# Patient Record
Sex: Female | Born: 1990 | Race: White | Hispanic: No | Marital: Single | Smoking: Never smoker
Health system: Southern US, Community
[De-identification: ages and names within clinical notes are randomized; demographics above are authoritative.]

---

## 2014-07-05 DIAGNOSIS — E039 Hypothyroidism, unspecified: Secondary | ICD-10-CM | POA: Insufficient documentation

## 2014-07-25 ENCOUNTER — Encounter: Payer: Self-pay | Admitting: Internal Medicine

## 2014-07-25 ENCOUNTER — Ambulatory Visit (INDEPENDENT_AMBULATORY_CARE_PROVIDER_SITE_OTHER): Payer: 59 | Admitting: Internal Medicine

## 2014-07-25 VITALS — BP 110/68 | HR 74 | Temp 98.1°F | Resp 12 | Ht 66.0 in | Wt 181.6 lb

## 2014-07-25 DIAGNOSIS — E221 Hyperprolactinemia: Secondary | ICD-10-CM

## 2014-07-25 DIAGNOSIS — E039 Hypothyroidism, unspecified: Secondary | ICD-10-CM

## 2014-07-25 DIAGNOSIS — D352 Benign neoplasm of pituitary gland: Secondary | ICD-10-CM

## 2014-07-25 NOTE — Patient Instructions (Signed)
Please come back for labs in 2 months >> we will see what other investigations we need after the results are back. Please return in 6 months with your sugar log.

## 2014-07-25 NOTE — Progress Notes (Addendum)
Jacqueline Ewing ID: Jacqueline Ewing, female   DOB: 05-Sep-1991, 23 y.o.   MRN: [id number]   HPI  Jacqueline Ewing is a 23 y.o.-year-old female, referred by her PCP, Dr. Drema Ewing, in consultation for hyperprolactinemia and also for management of hypothyroidism. She just moved from [place] - endocrinologist there: Dr. Dione Ewing. Almokayyad.  Hyperprolactinemia: She was found to have an elevated PRL in 03/2014 (? Level - will ask for records). At that time, she was having milky d/c from her breasts. This started 03/2014.   Latest level: 07/05/2014: 43.7  She has been on OCPs since 23 y/o for irregular, heavy periods, very painful. She started the brand name Ortho Tricyclen a year ago. No plans for pregnancy.  Has HAs - vertex, recently increased.   She does yoga min 3x a week.  She sleeps well. Not stressed at work.   Pt. has been dx with hypothyroidism in 03/2014; is on Levothyroxine 50 mcg, taken: - fasting - with water - separated by >30 min from b'fast  - no calcium, iron, PPIs, multivitamins   I reviewed pt's thyroid tests: 07/05/2014: TSH 1.35, fT4 0.74, TT3 140 (71-180)  Pt describes: - no fatigue - no weight gain - trying to lose weight - no no cold intolerance - no constipation - no dry skin - no hair falling - no depression - no palpitations  Pt denies feeling nodules in neck, hoarseness, dysphagia/odynophagia, SOB with lying down.  She has + FH of thyroid disorders in: both sides of family. No FH of thyroid cancer.  No h/o radiation tx to head or neck. No recent use of iodine supplements.  Reviewed labs sent bu PCP - 07/05/2014: - normal CMP - Lipids: 197/165/49/115 - normal CBC with diff   ROS: Constitutional: no weight gain/loss, no fatigue, no subjective hyperthermia/hypothermia Eyes: no blurry vision, no xerophthalmia ENT: no sore throat, no nodules palpated in throat, no dysphagia/odynophagia, no hoarseness, + tinnitus Cardiovascular: no CP/SOB/palpitations/leg  swelling Respiratory: no cough/SOB Gastrointestinal: no N/V/D/C Musculoskeletal: no muscle/joint aches Skin: no rashes Neurological: no tremors/numbness/tingling/dizziness, + HA Psychiatric: no depression/anxiety + see HPI  PMH: - see HPI  PSH: - none History   Social History  . Marital Status: Single    Spouse Name: N/A    Number of Children: 0   Occupational History  . Developmental specialist   Social History Main Topics  . Smoking status: Never Smoker   . Smokeless tobacco: Not on file  . Alcohol Use: 1.0 oz/week    2 drink(s) per week  . Drug Use: No   Social History Narrative   Single   Works: Artist    First Menstrual cycle: 14 yrs   0 pregnancies   Current Outpatient Rx  Name  Route  Sig  Dispense  Refill  . levothyroxine (SYNTHROID, LEVOTHROID) 50 MCG tablet   Oral   Take 50 mcg by mouth daily before breakfast.         . Norgestimate-Ethinyl Estradiol Triphasic (ORTHO TRI-CYCLEN, 28,) 0.18/0.215/0.25 MG-35 MCG tablet   Oral   Take 1 tablet by mouth daily.          NKDA  Family History  Problem Relation Age of Onset  . Thyroid disease Maternal Aunt   . Diabetes Maternal Uncle   . Diabetes Maternal Grandmother     Government social research officer  . Thyroid disease Maternal Grandmother   . Hypertension Paternal Grandfather   . Hyperlipidemia Paternal Grandfather   . Heart disease Paternal Grandfather   .  Thyroid disease Paternal Grandfather   . Thyroid disease Cousin    PE: BP 110/68  Pulse 74  Temp(Src) 98.1 F (36.7 C) (Oral)  Resp 12  Ht 5\' 6"  (1.676 m)  Wt 181 lb 9.6 oz (82.373 kg)  BMI 29.32 kg/m2  SpO2 97%  LMP 07/10/2014 Wt Readings from Last 3 Encounters:  07/25/14 181 lb 9.6 oz (82.373 kg)   Constitutional: overweight, in NAD Eyes: PERRLA, EOMI, no exophthalmos ENT: moist mucous membranes, no thyromegaly, no cervical lymphadenopathy Cardiovascular: RRR, No MRG Respiratory: CTA  B Gastrointestinal: abdomen soft, NT, ND, BS+ Musculoskeletal: no deformities, strength intact in all 4 Skin: moist, warm, no rashes Neurological: no tremor with outstretched hands, DTR normal in all 4  ASSESSMENT: 1. High PRL  2. Hypothyroidism  PLAN:  1. High Prolactin Jacqueline Ewing with persistent high prolactin levels. - I discussed with the Jacqueline Ewing about possible etiologies of high prolactin levels:  Pregnancy (denies, takes OCPs)  Hypothyroidism (she has this, but well controlled)  Stress (denies)  Exercise (yoga, walking - not intense)  Lack of sleep (denies)  Medications (she's not taking psychotropic medications or Reglan)  Drugs (on OCPs)  Chest wall lesions (denies)  Seizures (denies)  Liver ds (no)  Kidney disease (no)  A teratoma containing pituitary cells that can develop into prolactinoma (very rarely)  Macroprolactin (multimeric prolactin, especially since Jacqueline Ewing does not have galactorrhea)  Idiopathic - I believe the culprit for her high PRL can be her Estrogen supplementation in OCPs (breast d/c also mainly after her placebo week) - high prolactin most frequently impacts menstrual cycles, but she is on OCPs and we should not worry about this for now. No immediate plans for pregnancy. Her breast d/c is not spontaneous, not bothering her a lot. We discussed the possibility of tx of her high PRL levels (Cabergoline, low dose) if the high PRL level is not caused by macroprolactin. She agrees with the plan. - since hypothyroidism can impact the prolactin, we decided to wait 2 more months and recheck the TFTs and PRL, and if PRL not normalized, to proceed with further investigation - we will likely need her to come off OCPs for 2 mo before further labs.  2. Jacqueline Ewing with new dx hypothyroidism, on levothyroxine therapy, with normal TFTs now. We will obtain records from previous endo Re: previous TFTs and PRL levels. She appears euthyroid. She does not appear to  have a goiter, thyroid nodules, or neck compression symptoms - We discussed about correct intake of levothyroxine, fasting, with water, separated by at least 30 minutes from breakfast, and separated by more than 4 hours from calcium, iron, multivitamins, acid reflux medications (PPIs). - will check thyroid tests in 2 mo: TSH, free T4 - RTC in 6 months  Orders Placed This Encounter  Procedures  . TSH  . T4, free  . Thyroid Peroxidase Antibody  . Prolactin   Addendum 08/01/2014:  Received labs from [facility]: 04/01/2014: TSH 3.99, LH 0.1, prolactin 38.6 (1.8-20.3) 05/14/2014: CMP normal, Prolactin 45.3 (1.8-20.3), TPO antibodies 0.5 (less than 9), TSH 4.22  Component     Latest Ref Rng 09/24/2014  TSH     0.35 - 4.50 uIU/mL 1.45  Free T4     0.60 - 1.60 ng/dL 0.74  Thyroid Peroxidase Antibody     <9 IU/mL 1  Prolactin      50.8   Prolactin a little be higher than before. Thyroid tests are normal. In this case, I would like to investigate  her pituitary gland. I will ask the Jacqueline Ewing whether she agrees with a pituitary MRI.  Pituitary MRI 11/25/2014:  CLINICAL DATA: Elevated prolactin levels. Hyperprolactinemia.  EXAM: MRI HEAD WITHOUT AND WITH CONTRAST  TECHNIQUE: Multiplanar, multiecho pulse sequences of the brain and surrounding structures were obtained without and with intravenous contrast.  CONTRAST: 43mL MULTIHANCE GADOBENATE DIMEGLUMINE 529 MG/ML IV SOLN  COMPARISON: None.  FINDINGS: The pituitary gland is enlarged. A focus of differential enhancement along the right side of the gland measures 9 x 9.5 x 7 mm. The pituitary stalk is deviated to the left. There is no evidence for invasion of the cavernous sinus. Cavernous sinus is normal bilaterally. The optic chiasm and optic nerves are normal bilaterally as well.  The remainder the brain is unremarkable. No acute infarct, hemorrhage, or other mass lesion is present. The ventricles are  of normal size. No significant extraaxial fluid collection is present.  Postcontrast imaging through the remainder of the brain is unremarkable.  Flow is present in the major intracranial arteries. The globes and orbits are intact. Mild mucosal thickening is present in the anterior ethmoid air cells. The remaining paranasal sinuses and the mastoid air cells are clear.  IMPRESSION: 1. Right-sided pituitary microadenoma a measuring 9 x 9.5 x 7 mm. 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 2. The remainder the brain is unremarkable. 3. Minimal mucosal thickening within the anterior ethmoid air cells bilaterally. No other significant sinus disease is present.   Electronically Signed  By: Lawrence Santiago M.D.  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 The tumor is less than 1 cm and is unlikely to influence the rest of the pituitary function, but would like to call the Jacqueline Ewing back for a pituitary workup: - We cannot check LH and FSH since she is on oral contraceptives - We will check cortisol, ACTH, IGF-I - Thyroid tests have already been checked and she does not appear to have central hypothyroidism  I will see the Jacqueline Ewing back in 6 months, at that time, I would recheck the prolactin. There is only indication for repeat MRI if the prolactin increases. Indications for treatment are oligo/amenorrhea (this is a non-issue for her since she is on OCPs), galactorrhea (she has this), acne, hirsutism.   I called and d/w pt about new dx >> she will come for pituitary labs asap: Marland Kitchen ACTH  . Cortisol  . Insulin-like growth factor  . Growth hormone   Also, due to her galactorrhea, will start Cabergoline low dose, 0.25 mg weekly. Will recheck PRL when she returns. We discussed poss. SEs. Component     Latest Ref Rng 11/28/2014  IGF-I, LC/MS     83 - 456 ng/mL 222  Z-Score (Female)     -2.0-+2.0 SD 0.1   C206 ACTH     6 - 50 pg/mL 18  Cortisol, Plasma      15.6  Growth Hormone     <=7.1 ng/mL 1.5   The rest of the pituitary hormones are normal.

## 2014-08-01 ENCOUNTER — Encounter: Payer: Self-pay | Admitting: Internal Medicine

## 2014-08-01 DIAGNOSIS — E221 Hyperprolactinemia: Secondary | ICD-10-CM | POA: Insufficient documentation

## 2014-09-24 ENCOUNTER — Other Ambulatory Visit (INDEPENDENT_AMBULATORY_CARE_PROVIDER_SITE_OTHER): Payer: 59

## 2014-09-24 DIAGNOSIS — E221 Hyperprolactinemia: Secondary | ICD-10-CM

## 2014-09-24 DIAGNOSIS — E039 Hypothyroidism, unspecified: Secondary | ICD-10-CM

## 2014-09-24 LAB — TSH: TSH: 1.45 u[IU]/mL (ref 0.35–4.50)

## 2014-09-24 LAB — T4, FREE: Free T4: 0.74 ng/dL (ref 0.60–1.60)

## 2014-09-25 LAB — THYROID PEROXIDASE ANTIBODY: Thyroperoxidase Ab SerPl-aCnc: 1 IU/mL (ref <9)

## 2014-09-25 LAB — PROLACTIN: Prolactin: 50.8 ng/mL

## 2014-10-22 ENCOUNTER — Other Ambulatory Visit: Payer: 59

## 2014-11-12 ENCOUNTER — Telehealth: Payer: Self-pay | Admitting: Internal Medicine

## 2014-11-12 ENCOUNTER — Other Ambulatory Visit: Payer: Self-pay | Admitting: Internal Medicine

## 2014-11-12 DIAGNOSIS — E221 Hyperprolactinemia: Secondary | ICD-10-CM

## 2014-11-12 NOTE — Telephone Encounter (Signed)
Done, see separate note.

## 2014-11-12 NOTE — Telephone Encounter (Signed)
Please read note below and advise.  

## 2014-11-12 NOTE — Telephone Encounter (Signed)
No labs more recent than beginning of 09/2014... Please clarify.

## 2014-11-12 NOTE — Telephone Encounter (Signed)
Called pt and advised her of her labs (pt can't remember if she got the message). Pt is ok to have the MRI done. Be advised.

## 2014-11-12 NOTE — Telephone Encounter (Signed)
Patient would like to know the results of her lab work.

## 2014-11-25 ENCOUNTER — Ambulatory Visit
Admission: RE | Admit: 2014-11-25 | Discharge: 2014-11-25 | Disposition: A | Discharge status: Home / Self Care | Payer: PRIVATE HEALTH INSURANCE | Source: Ambulatory Visit | Attending: Internal Medicine | Admitting: Internal Medicine

## 2014-11-25 MED ORDER — CABERGOLINE 0.5 MG PO TABS: 0.2500 mg | ORAL_TABLET | ORAL | Status: DC

## 2014-11-25 MED ORDER — GADOBENATE DIMEGLUMINE 529 MG/ML IV SOLN
10.0000 mL | Freq: Once | INTRAVENOUS | Status: AC | PRN
  Administered 2014-11-25: 10 mL via INTRAVENOUS

## 2014-11-25 NOTE — Addendum Note (Signed)
Addended by: Philemon Kingdom on: 11/25/2014 06:28 PM   Modules accepted: Orders, Level of Service

## 2014-11-25 NOTE — Addendum Note (Signed)
Addended by: Philemon Kingdom on: 11/25/2014 06:15 PM   Modules accepted: Orders, Level of Service

## 2014-11-28 ENCOUNTER — Other Ambulatory Visit (INDEPENDENT_AMBULATORY_CARE_PROVIDER_SITE_OTHER): Payer: PRIVATE HEALTH INSURANCE

## 2014-11-28 DIAGNOSIS — D352 Benign neoplasm of pituitary gland: Secondary | ICD-10-CM

## 2014-11-28 LAB — CORTISOL: Cortisol, Plasma: 15.6 ug/dL

## 2014-12-01 LAB — GROWTH HORMONE: Growth Hormone: 1.5 ng/mL (ref <=7.1)

## 2014-12-02 LAB — ACTH: C206 ACTH: 18 pg/mL (ref 6–50)

## 2014-12-03 LAB — INSULIN-LIKE GROWTH FACTOR
IGF-I, LC/MS: 222 ng/mL (ref 83–456)
Z-Score (Female): 0.1 SD (ref ?–2.0)

## 2014-12-03 NOTE — Addendum Note (Signed)
Addended by: Philemon Kingdom on: 12/03/2014 05:11 PM   Modules accepted: Level of Service

## 2015-01-20 ENCOUNTER — Other Ambulatory Visit: Payer: Self-pay | Admitting: *Deleted

## 2015-01-20 MED ORDER — CABERGOLINE 0.5 MG PO TABS: 0.2500 mg | ORAL_TABLET | ORAL | Status: DC

## 2015-01-27 ENCOUNTER — Ambulatory Visit: Payer: 59 | Admitting: Internal Medicine

## 2015-01-30 ENCOUNTER — Ambulatory Visit (INDEPENDENT_AMBULATORY_CARE_PROVIDER_SITE_OTHER): Payer: Self-pay | Admitting: Internal Medicine

## 2015-01-30 ENCOUNTER — Encounter: Payer: Self-pay | Admitting: Internal Medicine

## 2015-01-30 VITALS — BP 102/64 | HR 89 | Temp 98.5°F | Resp 12 | Wt 171.0 lb

## 2015-01-30 DIAGNOSIS — E039 Hypothyroidism, unspecified: Secondary | ICD-10-CM | POA: Diagnosis not present

## 2015-01-30 DIAGNOSIS — E221 Hyperprolactinemia: Secondary | ICD-10-CM

## 2015-01-30 LAB — T4, FREE: Free T4: 0.79 ng/dL (ref 0.60–1.60)

## 2015-01-30 LAB — TSH: TSH: 1.84 u[IU]/mL (ref 0.35–4.50)

## 2015-01-30 NOTE — Patient Instructions (Signed)
Please stop at the lab.  Please come back for a follow-up appointment in 6 months.  

## 2015-01-30 NOTE — Progress Notes (Signed)
Patient ID: Jacqueline Ewing, female   DOB: 1990-11-15, 25 y.o.   MRN: [id number]   HPI  Rosslyn Pasion is a 24 y.o.-year-old female, initially referred by her PCP, Dr. Drema Dallas, in consultation for hyperprolactinemia and also for management of hypothyroidism. She moved from [place] - endocrinologist there: Dr. Dione Plover. Almokayyad.  Microprolactinoma:  Reviewed and updated hx: She was found to have an elevated PRL in 03/2014 (? Level - will ask for records). At that time, she was having milky d/c from her breasts. This started 03/2014.   Latest level: 09/24/2014: 50.8 07/05/2014: 43.7  She has been on OCPs since 24 y/o for irregular, heavy periods, very painful. She started the brand name Ortho Tricyclen a year ago. No plans for pregnancy.  Has HAs - vertex.  At last visit, we checked the rest of her pituitary hh >> normal.  We also checked a pituitary MRI >> microprolactinoma >> see Assessment section.  We started 0.25 mg Cabergoline once a week. She is still having milky discharge, but improved from 5 days to 2 days at the end of her menstrual cycles.   Pt. has been dx with hypothyroidism in 03/2014; is on Levothyroxine 50 mcg, taken: - fasting - with water - separated by >30 min from b'fast  - no calcium, iron, PPIs, multivitamins   I reviewed pt's thyroid tests: Lab Results  Component Value Date   TSH 1.45 09/24/2014   FREET4 0.74 09/24/2014   07/05/2014: TSH 1.35, fT4 0.74, TT3 140 (71-180)  Pt describes: - no fatigue - + weight loss - 10 lbs - no no cold intolerance - no constipation - no dry skin - no hair falling - no depression - no palpitations  Pt denies feeling nodules in neck, hoarseness, dysphagia/odynophagia, SOB with lying down.  ROS: Constitutional: + weight loss, no fatigue, no subjective hyperthermia/hypothermia Eyes: no blurry vision, no xerophthalmia ENT: no sore throat, no nodules palpated in throat, no dysphagia/odynophagia, no hoarseness, +  tinnitus Cardiovascular: no CP/SOB/palpitations/leg swelling Respiratory: no cough/SOB Gastrointestinal: no N/V/D/C Musculoskeletal: no muscle/joint aches Skin: no rashes Neurological: no tremors/numbness/tingling/dizziness, + HA  I reviewed pt's medications, allergies, PMH, social hx, family hx, and changes were documented in the history of present illness. Otherwise, unchanged from my initial visit note:  PMH: - see HPI  PSH: - none History   Social History  . Marital Status: Single    Spouse Name: N/A    Number of Children: 0   Occupational History  . Developmental specialist   Social History Main Topics  . Smoking status: Never Smoker   . Smokeless tobacco: Not on file  . Alcohol Use: 1.0 oz/week    2 drink(s) per week  . Drug Use: No   Social History Narrative   Single   Works: Artist    First Menstrual cycle: 14 yrs   0 pregnancies   Current Outpatient Rx  Name  Route  Sig  Dispense  Refill  . cabergoline (DOSTINEX) 0.5 MG tablet   Oral   Take 0.5 tablets (0.25 mg total) by mouth once a week.   10 tablet   1   . levothyroxine (SYNTHROID, LEVOTHROID) 50 MCG tablet   Oral   Take 50 mcg by mouth daily before breakfast.         . Norgestimate-Ethinyl Estradiol Triphasic (ORTHO TRI-CYCLEN, 28,) 0.18/0.215/0.25 MG-35 MCG tablet   Oral   Take 1 tablet by mouth daily.  NKDA  Family History  Problem Relation Age of Onset  . Thyroid disease Maternal Aunt   . Diabetes Maternal Uncle   . Diabetes Maternal Grandmother     Government social research officer  . Thyroid disease Maternal Grandmother   . Hypertension Paternal Grandfather   . Hyperlipidemia Paternal Grandfather   . Heart disease Paternal Grandfather   . Thyroid disease Paternal Grandfather   . Thyroid disease Cousin    PE: BP 102/64 mmHg  Pulse 89  Temp(Src) 98.5 F (36.9 C) (Oral)  Resp 12  Wt 171 lb (77.565 kg)  SpO2 97%  LMP  (Exact Date) Wt  Readings from Last 3 Encounters:  01/30/15 171 lb (77.565 kg)  07/25/14 181 lb 9.6 oz (82.373 kg)   Constitutional: overweight, in NAD Eyes: PERRLA, EOMI, no exophthalmos ENT: moist mucous membranes, no thyromegaly, no cervical lymphadenopathy Cardiovascular: RRR, No MRG Respiratory: CTA B Gastrointestinal: abdomen soft, NT, ND, BS+ Musculoskeletal: no deformities, strength intact in all 4 Skin: moist, warm, no rashes Neurological: no tremor with outstretched hands, DTR normal in all 4  ASSESSMENT: 1. Microprolactinoma   Component     Latest Ref Rng 09/24/2014  TSH     0.35 - 4.50 uIU/mL 1.45  Free T4     0.60 - 1.60 ng/dL 0.74  Thyroid Peroxidase Antibody     <9 IU/mL 1  Prolactin      50.8   - The rest of the pituitary hormones are normal: Component     Latest Ref Rng 11/28/2014  IGF-I, LC/MS     83 - 456 ng/mL 222  Z-Score (Female)     -2.0-+2.0 SD 0.1  C206 ACTH     6 - 50 pg/mL 18  Cortisol, Plasma      15.6  Growth Hormone     <=7.1 ng/mL 1.5   Labs from [facility]: 04/01/2014: TSH 3.99, LH 0.1, prolactin 38.6 (1.8-20.3) 05/14/2014: CMP normal, Prolactin 45.3 (1.8-20.3), TPO antibodies 0.5 (less than 9), TSH 4.22  - Pituitary MRI (11/30/2014): IMPRESSION: 1. Right-sided pituitary microadenoma a measuring 9 x 9.5 x 7 mm. 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 2. The remainder the brain is unremarkable. 3. Minimal mucosal thickening within the anterior ethmoid air cells bilaterally. No other significant sinus disease is present.  2. Hypothyroidism  PLAN:  1. Microprolactinoma - on cabergoline low dose, 0.25 mg weekly 2/2 galactorrhea - continue OCPs - we again discussed that high prolactin most frequently impacts menstrual cycles, but she is on OCPs and we should not worry about this for now. No immediate plans for pregnancy. Her breast d/c is better, but not resolved >> we may need to increase Cabergoline to 0.25 mg 2x  a week. I will decide when PRL level is back. - we discussed about the fact that the rest of the pituitary hhs were normal.  - no need for a new pituitary MRI unless PRL increasing  2. Patient with recent dx hypothyroidism, on levothyroxine 50 mcg therapy, with normal TFTs now. She appears euthyroid. She does not appear to have a goiter, thyroid nodules, or neck compression symptoms - We again discussed about correct intake of levothyroxine, fasting, with water, separated by at least 30 minutes from breakfast, and separated by more than 4 hours from calcium, iron, multivitamins, acid reflux medications (PPIs). She is taking it correctly. - will check thyroid tests today: TSH, free T4 - RTC in 6 months, then yearly if tests normal and no sxs  Office Visit  on 01/30/2015  Component Date Value Ref Range Status  . Prolactin 01/30/2015 29.2   Final   Comment:      Reference Ranges:                  Female:                       2.1 -  17.1 ng/ml                  Female:   Pregnant          9.7 - 208.5 ng/mL                            Non Pregnant      2.8 -  29.2 ng/mL                            Post Menopausal   1.8 -  20.3 ng/mL                      . TSH 01/30/2015 1.84  0.35 - 4.50 uIU/mL Final  . Free T4 01/30/2015 0.79  0.60 - 1.60 ng/dL Final   TFTs normal. PRL better but at the ULN >> increase the Cabergoline to 0.25 mg 2x a week, as planned.

## 2015-01-31 LAB — PROLACTIN: PROLACTIN: 29.2 ng/mL

## 2015-01-31 MED ORDER — CABERGOLINE 0.5 MG PO TABS: 0.2500 mg | ORAL_TABLET | ORAL | Status: DC

## 2015-03-11 ENCOUNTER — Other Ambulatory Visit: Payer: Self-pay | Admitting: Family Medicine

## 2015-03-11 ENCOUNTER — Other Ambulatory Visit (HOSPITAL_COMMUNITY)
Admission: RE | Admit: 2015-03-11 | Discharge: 2015-03-11 | Disposition: A | Discharge status: Home / Self Care | Payer: 59 | Source: Ambulatory Visit | Attending: Family Medicine | Admitting: Family Medicine

## 2015-03-11 DIAGNOSIS — Z113 Encounter for screening for infections with a predominantly sexual mode of transmission: Secondary | ICD-10-CM | POA: Diagnosis present

## 2015-03-11 DIAGNOSIS — Z124 Encounter for screening for malignant neoplasm of cervix: Secondary | ICD-10-CM | POA: Insufficient documentation

## 2015-03-14 LAB — CYTOLOGY - PAP

## 2015-03-19 ENCOUNTER — Other Ambulatory Visit: Payer: Self-pay | Admitting: *Deleted

## 2015-03-19 MED ORDER — CABERGOLINE 0.5 MG PO TABS: 0.2500 mg | ORAL_TABLET | ORAL | Status: DC

## 2015-06-01 ENCOUNTER — Other Ambulatory Visit: Payer: Self-pay | Admitting: Internal Medicine

## 2015-08-01 ENCOUNTER — Ambulatory Visit (INDEPENDENT_AMBULATORY_CARE_PROVIDER_SITE_OTHER): Payer: PRIVATE HEALTH INSURANCE | Admitting: Internal Medicine

## 2015-08-01 ENCOUNTER — Encounter: Payer: Self-pay | Admitting: Internal Medicine

## 2015-08-01 VITALS — BP 104/62 | HR 90 | Temp 98.7°F | Resp 12 | Wt 183.6 lb

## 2015-08-01 DIAGNOSIS — E039 Hypothyroidism, unspecified: Secondary | ICD-10-CM | POA: Diagnosis not present

## 2015-08-01 DIAGNOSIS — D352 Benign neoplasm of pituitary gland: Secondary | ICD-10-CM | POA: Diagnosis not present

## 2015-08-01 DIAGNOSIS — E221 Hyperprolactinemia: Secondary | ICD-10-CM | POA: Diagnosis not present

## 2015-08-01 LAB — T4, FREE: FREE T4: 0.73 ng/dL (ref 0.60–1.60)

## 2015-08-01 LAB — TSH: TSH: 3.02 u[IU]/mL (ref 0.35–4.50)

## 2015-08-01 NOTE — Patient Instructions (Addendum)
Please stop at the lab.  Please continue 0.5 mg Cabergoline 2x a week for now but we will probably need to decrease the dose to 1/2 a tablet 2x a week after results are back.  Continue Levothyroxine 50 mcg daily.  Take the thyroid hormone every day, with water, >30 minutes before breakfast, separated by >4 hours from acid reflux medications, calcium, iron, multivitamins.  Please return in 1 year.

## 2015-08-01 NOTE — Progress Notes (Signed)
Patient ID: Jacqueline Ewing, female   DOB: 1991-05-29, 24 y.o.   MRN: [id number]   HPI  Jacqueline Ewing is a 24 y.o.-year-old female, initially referred by her PCP, Dr. 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 She moved from [place] - endocrinologist there: Dr. Dione Plover. Almokayyad. Last visit 6 months ago.  Microprolactinoma: Reviewed and updated hx: She was found to have an elevated PRL in 03/2014 (? Level - will ask for records). At that time, she was having milky d/c from her breasts. This started 03/2014.   Latest level: Lab Results  Component Value Date   PROLACTIN 29.2 01/30/2015   PROLACTIN 50.8 09/24/2014  09/24/2014: 50.8 07/05/2014: 43.7 At last visit, we checked the rest of her pituitary hh >> normal.  We also checked a pituitary MRI >> microprolactinoma.  We started 0.25 mg Cabergoline once a week. She was still having milky discharge, and PRL was still high, at 29 at last visit >> I advised her to increase the dose to 0.25 mg 2x a week (she misunderstood and took 1 tablet of 0.5 mg 2x a week!).  Her galactorrhea is now resolved.  She has been on OCPs since 24 y/o for irregular, heavy periods, very painful. She is on brand name Ortho Tricyclen. No immediate plans for pregnancy.  Pt. has been dx with hypothyroidism in 03/2014; is on Levothyroxine 50 mcg, taken: - fasting - with water - separated by >30 min from b'fast  - no calcium, iron, PPIs, multivitamins   I reviewed pt's thyroid tests: Lab Results  Component Value Date   TSH 1.84 01/30/2015   TSH 1.45 09/24/2014   FREET4 0.79 01/30/2015   FREET4 0.74 09/24/2014  07/05/2014: TSH 1.35, fT4 0.74, TT3 140 (71-180)  Component     Latest Ref Rng 09/24/2014  Thyroid Peroxidase Antibody     <9 IU/mL 1   Pt describes: - no fatigue - + weight gain - no no cold intolerance - no constipation - no dry skin - no hair falling - no depression - no palpitations - + HA  Pt denies feeling  nodules in neck, hoarseness, dysphagia/odynophagia, SOB with lying down.  ROS: Constitutional: + weight gain, no fatigue, no subjective hyperthermia/hypothermia Eyes: no blurry vision, no xerophthalmia ENT: no sore throat, no nodules palpated in throat, no dysphagia/odynophagia, no hoarseness, resolved tinnitus Cardiovascular: no CP/SOB/palpitations/leg swelling Respiratory: no cough/SOB Gastrointestinal: no N/V/D/C Musculoskeletal: no muscle/joint aches Skin: no rashes Neurological: no tremors/numbness/tingling/dizziness, + HAs  I reviewed pt's medications, allergies, PMH, social hx, family hx, and changes were documented in the history of present illness. Otherwise, unchanged from my initial visit note:  PMH: - see HPI  PSH: - none History   Social History  . Marital Status: Single    Spouse Name: N/A    Number of Children: 0   Occupational History  . Developmental specialist   Social History Main Topics  . Smoking status: Never Smoker   . Smokeless tobacco: Not on file  . Alcohol Use: 1.0 oz/week    2 drink(s) per week  . Drug Use: No   Social History Narrative   Single   Works: Artist    First Menstrual cycle: 14 yrs   0 pregnancies   Current Outpatient Rx  Name  Route  Sig  Dispense  Refill  . cabergoline (DOSTINEX) 0.5 MG tablet   Oral   Take 0.5 tablets (0.25 mg total) by mouth 2 (two) times a week.  10 tablet   2   . cabergoline (DOSTINEX) 0.5 MG tablet   Oral   Take 0.5 tablets (0.25 mg total) by mouth 2 (two) times a week.   24 tablet   0   . levothyroxine (SYNTHROID, LEVOTHROID) 50 MCG tablet   Oral   Take 50 mcg by mouth daily before breakfast.         . Norgestimate-Ethinyl Estradiol Triphasic (ORTHO TRI-CYCLEN, 28,) 0.18/0.215/0.25 MG-35 MCG tablet   Oral   Take 1 tablet by mouth daily.          NKDA  Family History  Problem Relation Age of Onset  . Thyroid disease Maternal Aunt   .  Diabetes Maternal Uncle   . Diabetes Maternal Grandmother     Government social research officer  . Thyroid disease Maternal Grandmother   . Hypertension Paternal Grandfather   . Hyperlipidemia Paternal Grandfather   . Heart disease Paternal Grandfather   . Thyroid disease Paternal Grandfather   . Thyroid disease Cousin    PE: BP 104/62 mmHg  Pulse 90  Temp(Src) 98.7 F (37.1 C) (Oral)  Resp 12  Wt 183 lb 9.6 oz (83.28 kg)  SpO2 98% Body mass index is 29.65 kg/(m^2). Wt Readings from Last 3 Encounters:  08/01/15 183 lb 9.6 oz (83.28 kg)  01/30/15 171 lb (77.565 kg)  07/25/14 181 lb 9.6 oz (82.373 kg)   Constitutional: overweight, in NAD Eyes: PERRLA, EOMI, no exophthalmos ENT: moist mucous membranes, no thyromegaly, no cervical lymphadenopathy Cardiovascular: RRR, No MRG Respiratory: CTA B Gastrointestinal: abdomen soft, NT, ND, BS+ Musculoskeletal: no deformities, strength intact in all 4 Skin: moist, warm, no rashes Neurological: no tremor with outstretched hands, DTR normal in all 4  ASSESSMENT: 1. Microprolactinoma   Component     Latest Ref Rng 09/24/2014  Prolactin      50.8   - The rest of the pituitary hormones are normal: Component     Latest Ref Rng 11/28/2014  IGF-I, LC/MS     83 - 456 ng/mL 222  Z-Score (Female)     -2.0-+2.0 SD 0.1  C206 ACTH     6 - 50 pg/mL 18  Cortisol, Plasma      15.6  Growth Hormone     <=7.1 ng/mL 1.5   Labs from [facility]: 04/01/2014: TSH 3.99, LH 0.1, prolactin 38.6 (1.8-20.3) 05/14/2014: CMP normal, Prolactin 45.3 (1.8-20.3), TPO antibodies 0.5 (less than 9), TSH 4.22  - Pituitary MRI (11/30/2014): IMPRESSION: 1. Right-sided pituitary microadenoma a measuring 9 x 9.5 x 7 mm. 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 2. The remainder the brain is unremarkable. 3. Minimal mucosal thickening within the anterior ethmoid air cells bilaterally. No other significant sinus disease is present.  2.  Hypothyroidism  Component     Latest Ref Rng 09/24/2014  Thyroid Peroxidase Antibody     <9 IU/mL 1   PLAN:  1. Microprolactinoma - on cabergoline 0.5 mg 2x weekly as she had galactorrhea (she misunderstood the instructions and is taking 2x the dose I recommended...). Her breast d/c is resolved. We will check a PRL level today and I believe we can then decrease the dose to 0.25 mg 2x a week. We discussed that we should use the minimum dose necessary to keep the PRL in the normal range and avoid galactorrhea.  - High prolactin most frequently impacts menstrual cycles, but she is on OCPs and we should not worry about this for now. No immediate plans for pregnancy. Continue  OCPs - the rest of the pituitary hh's were normal 7 mo ago, no need to repeat - no need for a new pituitary MRI unless PRL starts increasing  2. Patient with mild hypothyroidism, on levothyroxine 50 mcg therapy, with normal TFTs now. Thyroid antibodies were negative, so no sign of Hashimoto's thyroiditis - She appears euthyroid. She does not appear to have a goiter, thyroid nodules, or neck compression symptoms - We again discussed about correct intake of levothyroxine, fasting, with water, separated by at least 30 minutes from breakfast, and separated by more than 4 hours from calcium, iron, multivitamins, acid reflux medications (PPIs). She is taking it correctly. - will check thyroid tests today: TSH, free T4 - RTC in one year  Office Visit on 08/01/2015  Component Date Value Ref Range Status  . TSH 08/01/2015 3.02  0.35 - 4.50 uIU/mL Final  . Free T4 08/01/2015 0.73  0.60 - 1.60 ng/dL Final  . Prolactin 08/01/2015 32.2   Final   Comment:      Reference Ranges:                  Female:                       2.1 -  17.1 ng/ml                  Female:   Pregnant          9.7 - 208.5 ng/mL                            Non Pregnant      2.8 -  29.2 ng/mL                            Post Menopausal   1.8 -  20.3 ng/mL                        TFTs normal. PRL has not decreased further, despite increasing the cabergoline to 0.5 mg twice weekly. I would suggest to continue the same dose for now.

## 2015-08-02 LAB — PROLACTIN: Prolactin: 32.2 ng/mL

## 2015-08-05 MED ORDER — CABERGOLINE 0.5 MG PO TABS: 0.5000 mg | ORAL_TABLET | ORAL | Status: DC

## 2015-09-04 ENCOUNTER — Other Ambulatory Visit: Payer: Self-pay | Admitting: Internal Medicine

## 2015-09-24 ENCOUNTER — Other Ambulatory Visit: Payer: Self-pay | Admitting: Family Medicine

## 2015-09-24 ENCOUNTER — Other Ambulatory Visit (HOSPITAL_COMMUNITY)
Admission: RE | Admit: 2015-09-24 | Discharge: 2015-09-24 | Disposition: A | Discharge status: Home / Self Care | Payer: 59 | Source: Ambulatory Visit | Attending: Family Medicine | Admitting: Family Medicine

## 2015-09-24 DIAGNOSIS — Z124 Encounter for screening for malignant neoplasm of cervix: Secondary | ICD-10-CM | POA: Diagnosis present

## 2015-09-26 LAB — CYTOLOGY - PAP

## 2015-12-26 ENCOUNTER — Other Ambulatory Visit: Payer: Self-pay | Admitting: Internal Medicine

## 2016-04-06 ENCOUNTER — Other Ambulatory Visit: Payer: Self-pay | Admitting: Internal Medicine

## 2016-05-24 ENCOUNTER — Other Ambulatory Visit: Payer: Self-pay | Admitting: Internal Medicine

## 2016-05-26 IMAGING — MR MR HEAD WO/W CM
8 of 18 series · 29 of 48 positions shown · IV contrast (Yes)
Comparison: None.

CLINICAL DATA: Elevated prolactin levels.  Hyperprolactinemia.

EXAM:
MRI HEAD WITHOUT AND WITH CONTRAST
TECHNIQUE: Multiplanar, multiecho pulse sequences of the brain and surrounding
structures were obtained without and with intravenous contrast.
CONTRAST:  10mL MULTIHANCE GADOBENATE DIMEGLUMINE 529 MG/ML IV SOLN

[Series 3: FLAIR · sagittal · 5.0mm · 0.47mm/px · 2 of 25 slices shown (1 of 2)]
[im 1/25]
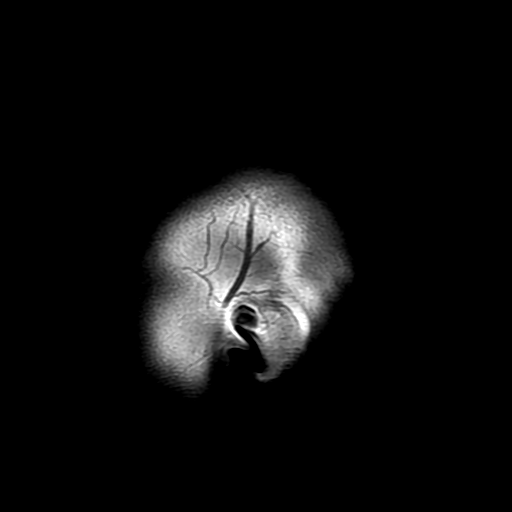
[im 25/25]
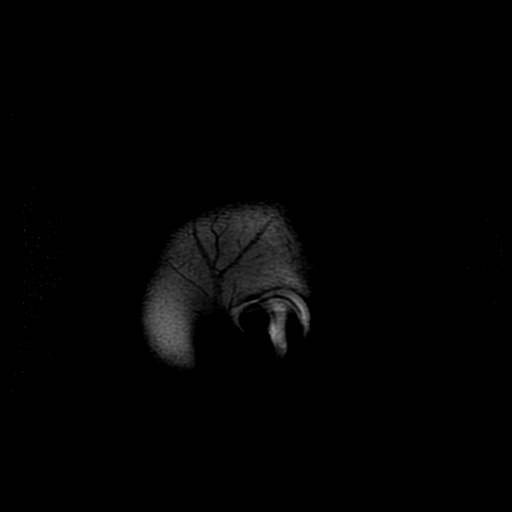

[Series 4: DWI · axial · 3.0mm · 1.09mm/px · z∈[-77,+80]mm · 8 of 108 slices shown (1 of 2)]
[im 1/108]
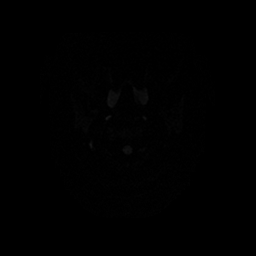
[im 12/108]
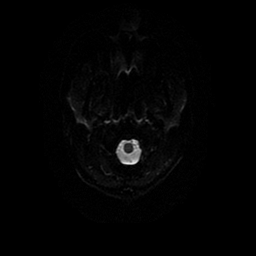
[im 36/108]
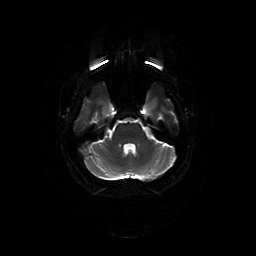
[im 48/108]
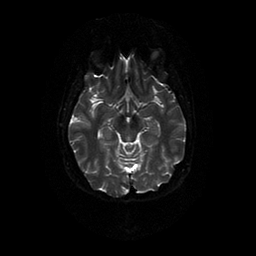
[im 60/108]
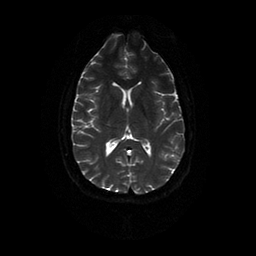
[im 72/108]
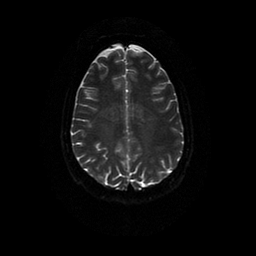
[im 96/108]
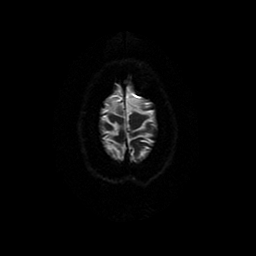
[im 108/108]
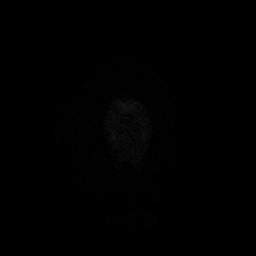

[Series 5: FLAIR · axial · 5.0mm · 0.43mm/px · z∈[-67,+87]mm · 3 of 27 slices shown (2 of 2)]
[im 1/27]
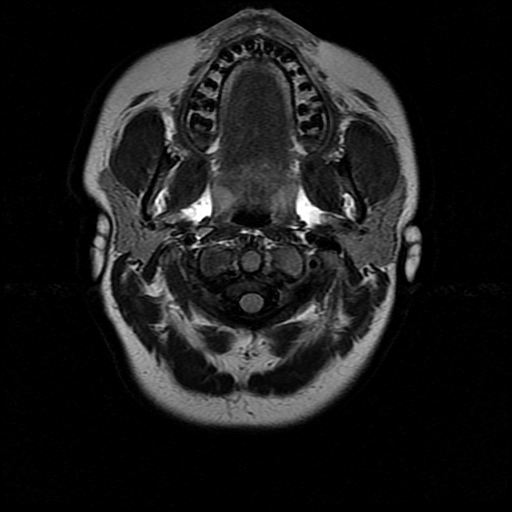
[im 14/27]
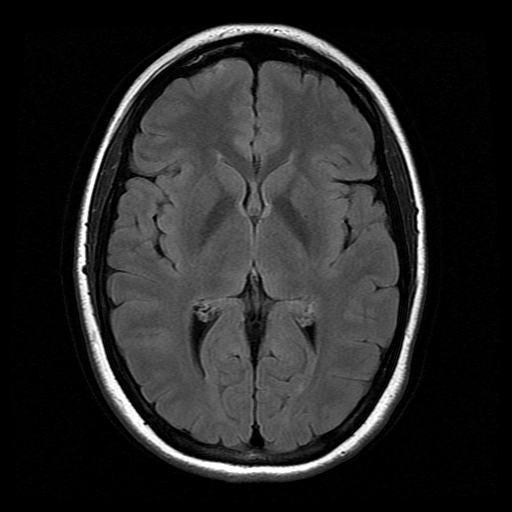
[im 27/27]
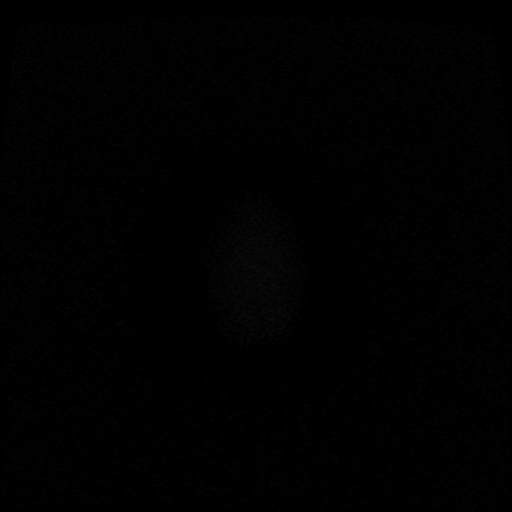

[Series 6: (id) · axial · 5.0mm · 0.43mm/px · z∈[-67,+87]mm · 3 of 27 slices shown]
[im 1/27]
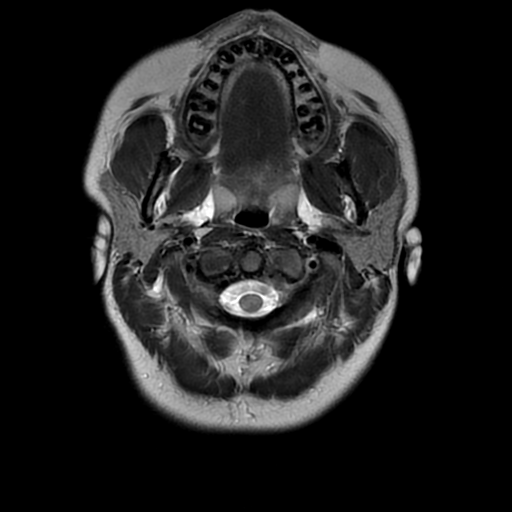
[im 14/27]
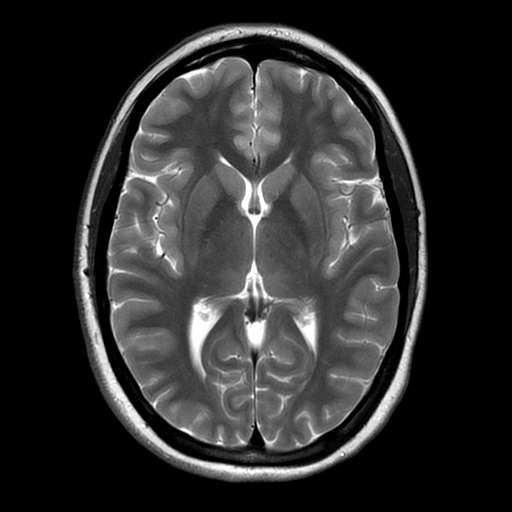
[im 27/27]
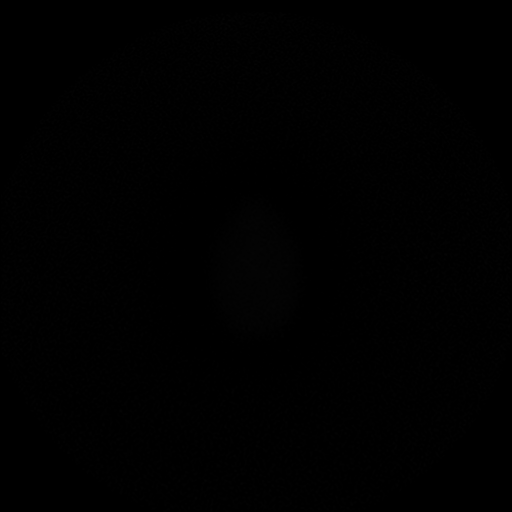

[Series 7: T2-star · axial · 5.0mm · 0.43mm/px · z∈[-67,+87]mm · 3 of 27 slices shown]
[im 1/27]
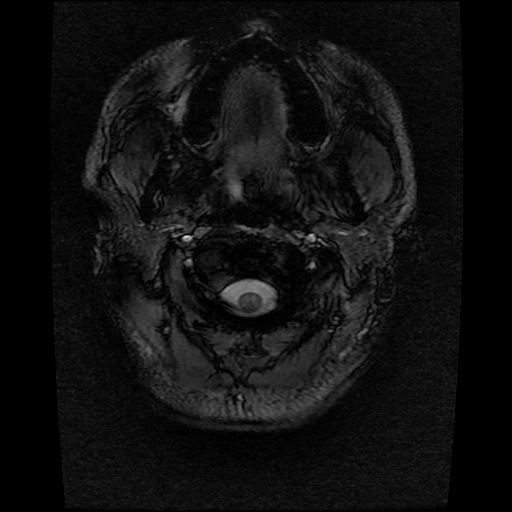
[im 14/27]
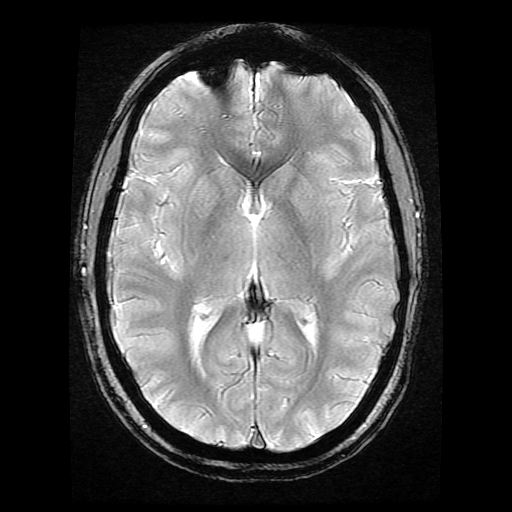
[im 27/27]
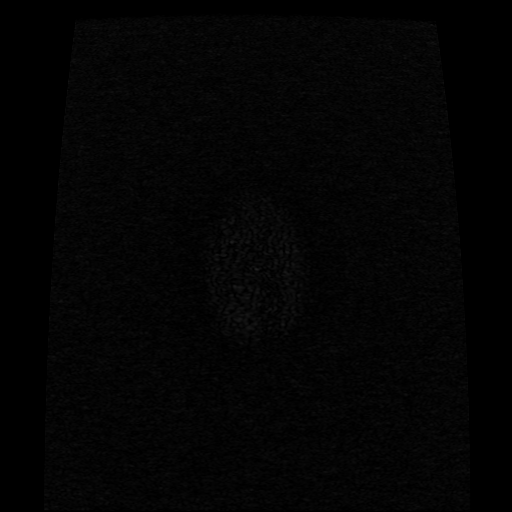

[Series 8: sag se thins · sagittal · 3.0mm · 0.31mm/px · 2 of 15 slices shown]
[im 1/15]
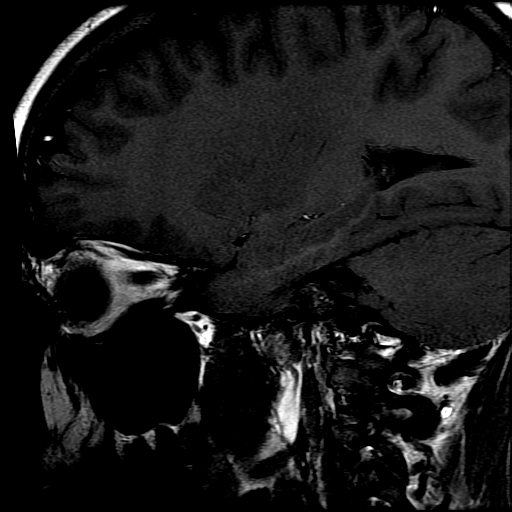
[im 15/15]
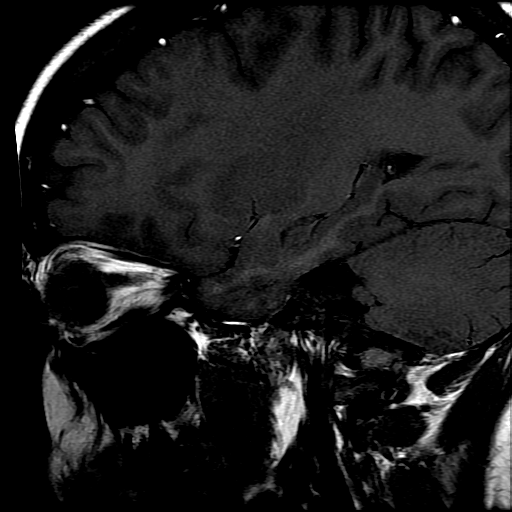

[Series 9: cor se thins · coronal · 3.0mm · 0.31mm/px · 2 of 15 slices shown]
[im 1/15]
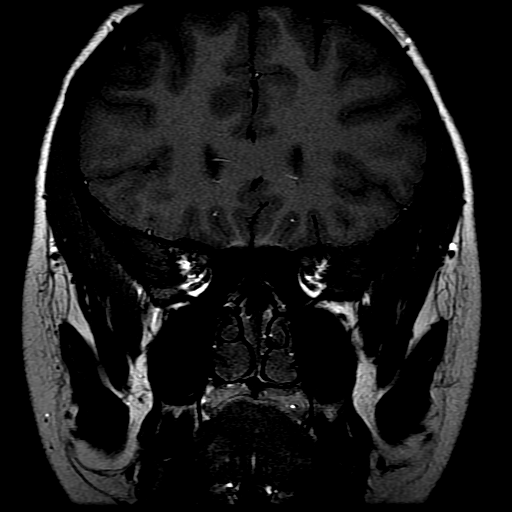
[im 15/15]
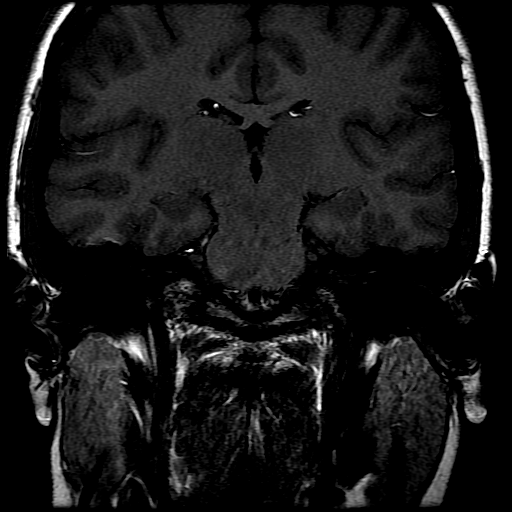

[Series 400: DWI · axial · 3.0mm · 1.09mm/px · z∈[-77,+80]mm · 6 of 54 slices shown (2 of 2)]
[im 1/54]
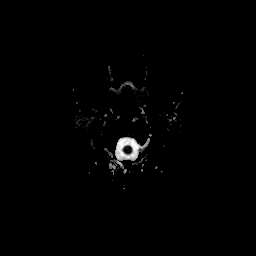
[im 11/54]
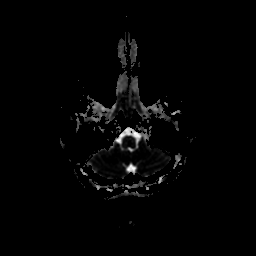
[im 22/54]
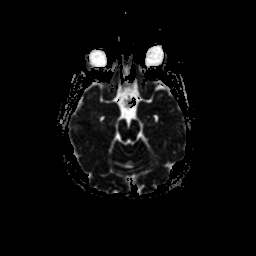
[im 32/54]
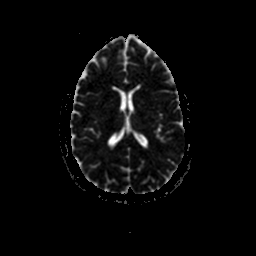
[im 43/54]
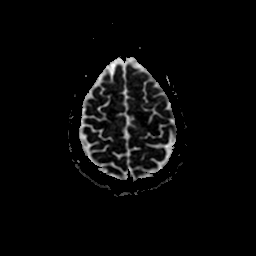
[im 54/54]
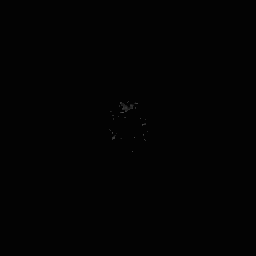

[29 of 48 positions shown; findings below may reference images not displayed]

FINDINGS: The pituitary gland is enlarged. A focus of differential enhancement
along the right side of the gland measures 9 x 9.5 x 7 mm. The
pituitary stalk is deviated to the left. There is no evidence for
invasion of the cavernous sinus. Cavernous sinus is normal
bilaterally. The optic chiasm and optic nerves are normal
bilaterally as well.

The remainder the brain is unremarkable. No acute infarct,
hemorrhage, or other mass lesion is present. The ventricles are of
normal size. No significant extraaxial fluid collection is present.

Postcontrast imaging through the remainder of the brain is
unremarkable.

Flow is present in the major intracranial arteries. The globes and
orbits are intact. Mild mucosal thickening is present in the
anterior ethmoid air cells. The remaining paranasal sinuses and the
mastoid air cells are clear.
IMPRESSION: 1. Right-sided pituitary microadenoma a measuring 9 x 9.5 x 7 mm.
This likely represents a prolactin secreting pituitary macroadenoma.
2. The remainder the brain is unremarkable.
3. Minimal mucosal thickening within the anterior ethmoid air cells
bilaterally. No other significant sinus disease is present.

## 2016-07-30 ENCOUNTER — Ambulatory Visit (INDEPENDENT_AMBULATORY_CARE_PROVIDER_SITE_OTHER): Payer: 59 | Admitting: Internal Medicine

## 2016-07-30 VITALS — BP 122/82 | HR 94 | Wt 206.0 lb

## 2016-07-30 DIAGNOSIS — D352 Benign neoplasm of pituitary gland: Secondary | ICD-10-CM | POA: Diagnosis not present

## 2016-07-30 DIAGNOSIS — E039 Hypothyroidism, unspecified: Secondary | ICD-10-CM | POA: Diagnosis not present

## 2016-07-30 LAB — T4, FREE: Free T4: 0.54 ng/dL — ABNORMAL LOW (ref 0.60–1.60)

## 2016-07-30 LAB — TSH: TSH: 2.71 u[IU]/mL (ref 0.35–4.50)

## 2016-07-30 NOTE — Progress Notes (Signed)
Patient ID: Jacqueline Ewing, female   DOB: 06-20-1991, 25 y.o.   MRN: [id number]   HPI  Jacqueline Ewing is a 25 y.o.-year-old female, initially referred by her PCP, Dr. 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  Last visit 1 year ago. She moved from [place] - endocrinologist there: Dr. Dione Plover. Almokayyad.  Microprolactinoma: Reviewed and updated hx: She was found to have an elevated PRL in 03/2014 (? Level - will ask for records). At that time, she was having milky d/c from her breasts. This started 03/2014.   Latest level: Lab Results  Component Value Date   PROLACTIN 32.2 08/01/2015   PROLACTIN 29.2 01/30/2015   PROLACTIN 50.8 09/24/2014  09/24/2014: 50.8 07/05/2014: 43.7 At last visit, we checked the rest of her pituitary hh >> normal.  We also checked a pituitary MRI (11/25/2014) >> microprolactinoma.  We started 0.25 mg Cabergoline once a week. She was still having milky discharge, and PRL was still high, at 29 at last visit >> I advised her to increase the dose to 0.25 mg 2x a week (she misunderstood and took 1 tablet of 0.5 mg 2x a week!).  Her galactorrhea is now mostly resolved, rarely a drop of milk occasional.  She has been on OCPs since 25 y/o for irregular, heavy periods, very painful. She was on brand name Ortho Tricyclen >> now on Mirena IUD. No immediate plans for pregnancy.   Pt. has been dx with hypothyroidism in 03/2014; is on Levothyroxine 50 mcg, taken: - fasting - with water - separated by >30 min from b'fast  - no calcium, iron, PPIs, multivitamins   I reviewed pt's thyroid tests: Lab Results  Component Value Date   TSH 3.02 08/01/2015   TSH 1.84 01/30/2015   TSH 1.45 09/24/2014   FREET4 0.73 08/01/2015   FREET4 0.79 01/30/2015   FREET4 0.74 09/24/2014  07/05/2014: TSH 1.35, fT4 0.74, TT3 140 (71-180)  Component     Latest Ref Rng 09/24/2014  Thyroid Peroxidase Antibody     <9 IU/mL 1   Pt describes: - no fatigue - +  weight gain - no no cold intolerance - no constipation - no dry skin - no hair falling - no depression - no palpitations - + HA  Pt denies feeling nodules in neck, hoarseness, dysphagia/odynophagia, SOB with lying down.  ROS: Constitutional: + weight gain, no fatigue, no subjective hyperthermia/hypothermia Eyes: no blurry vision, no xerophthalmia ENT: no sore throat, no nodules palpated in throat, no dysphagia/odynophagia, no hoarseness, resolved tinnitus Cardiovascular: no CP/SOB/palpitations/leg swelling Respiratory: no cough/SOB Gastrointestinal: no N/V/D/C Musculoskeletal: no muscle/joint aches Skin: no rashes Neurological: no tremors/numbness/tingling/dizziness, + HAs  I reviewed pt's medications, allergies, PMH, social hx, family hx, and changes were documented in the history of present illness. Otherwise, unchanged from my initial visit note:  PMH: - see HPI  PSH: - none History   Social History  . Marital Status: Single    Spouse Name: N/A    Number of Children: 0   Occupational History  . Developmental specialist   Social History Main Topics  . Smoking status: Never Smoker   . Smokeless tobacco: Not on file  . Alcohol Use: 1.0 oz/week    2 drink(s) per week  . Drug Use: No   Social History Narrative   Single   Works: Artist    First Menstrual cycle: 14 yrs   0 pregnancies   Current Outpatient Rx  . Order #: [id number] Class: Normal  .  Order #: [id number] Class: Historical Med  . Order #: [id number] Class: Historical Med   NKDA  Family History  Problem Relation Age of Onset  . Thyroid disease Maternal Aunt   . Diabetes Maternal Uncle   . Diabetes Maternal Grandmother     Government social research officer  . Thyroid disease Maternal Grandmother   . Hypertension Paternal Grandfather   . Hyperlipidemia Paternal Grandfather   . Heart disease Paternal Grandfather   . Thyroid disease Paternal Grandfather   . Thyroid disease  Cousin    PE: There were no vitals taken for this visit. There is no height or weight on file to calculate BMI. Wt Readings from Last 3 Encounters:  08/01/15 183 lb 9.6 oz (83.3 kg)  01/30/15 171 lb (77.6 kg)  07/25/14 181 lb 9.6 oz (82.4 kg)   Constitutional: overweight, in NAD Eyes: PERRLA, EOMI, no exophthalmos ENT: moist mucous membranes, no thyromegaly, no cervical lymphadenopathy Cardiovascular: RRR, No MRG Respiratory: CTA B Gastrointestinal: abdomen soft, NT, ND, BS+ Musculoskeletal: no deformities, strength intact in all 4 Skin: moist, warm, no rashes Neurological: no tremor with outstretched hands, DTR normal in all 4  ASSESSMENT: 1. Microprolactinoma   Component     Latest Ref Rng 09/24/2014  Prolactin      50.8   - The rest of the pituitary hormones are normal: Component     Latest Ref Rng 11/28/2014  IGF-I, LC/MS     83 - 456 ng/mL 222  Z-Score (Female)     -2.0-+2.0 SD 0.1  C206 ACTH     6 - 50 pg/mL 18  Cortisol, Plasma      15.6  Growth Hormone     <=7.1 ng/mL 1.5   Labs from [facility]: 04/01/2014: TSH 3.99, LH 0.1, prolactin 38.6 (1.8-20.3) 05/14/2014: CMP normal, Prolactin 45.3 (1.8-20.3), TPO antibodies 0.5 (less than 9), TSH 4.22  - Pituitary MRI (11/30/2014): IMPRESSION: 1. Right-sided pituitary microadenoma a measuring 9 x 9.5 x 7 mm. 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 2. The remainder the brain is unremarkable. 3. Minimal mucosal thickening within the anterior ethmoid air cells bilaterally. No other significant sinus disease is present.  2. Hypothyroidism  Component     Latest Ref Rng 09/24/2014  Thyroid Peroxidase Antibody     <9 IU/mL 1   PLAN:  1. Microprolactinoma - on cabergoline 0.5 mg 2x weekly as she had galactorrhea (she misunderstood the instructions and started taking 2x the dose I recommended...). No more breast discharge. At last visit, we checked her prolactin level with the purpose  of decreasing the cabergoline further, to 0.25 mg twice a week, however, the prolactin returned at 32. Therefore, I continued the 0.5 mg twice weekly. - We will check a PRL level today and I believe we can then decrease the dose to 0.25 mg 2x a week. We discussed that we should use the minimum dose necessary to keep the PRL in the normal range and avoid galactorrhea. She does have a drop of milk expressed spontaneously occasionally, she tells me. She also has more HAs which she attributes to stress at work. - High prolactin most frequently impacts menstrual cycles, but she is on an IUD and we should not worry about this for now. No immediate plans for pregnancy. - the rest of the pituitary hh's were normal at last check, no need to repeat - no need for a new pituitary MRI unless PRL starts increasing  2. Patient with mild hypothyroidism, on levothyroxine 50 mcg therapy,  with normal TFTs now. Thyroid antibodies were negative, so no sign of Hashimoto's thyroiditis - She appears euthyroid. She does not appear to have a goiter, thyroid nodules, or neck compression symptoms - We again discussed about correct intake of levothyroxine, fasting, with water, separated by at least 30 minutes from breakfast, and separated by more than 4 hours from calcium, iron, multivitamins, acid reflux medications (PPIs). She is taking it correctly. - will check thyroid tests today: TSH, free T4 - RTC in one year  Orders Placed This Encounter  Procedures  . Prolactin  . TSH  . T4, free  Needs refills of both meds.  Office Visit on 07/30/2016  Component Date Value Ref Range Status  . Prolactin 07/31/2016 17.6  ng/mL Final   Comment: Reference Range Adult Female  Non-Pregnant 3.0-30.0 ng/mL  Pregnant 10.0-209.0 ng/mL  Postmenopausal 2.0-20.0 ng/mL     . TSH 07/30/2016 2.71  0.35 - 4.50 uIU/mL Final  . Free T4 07/30/2016 0.54* 0.60 - 1.60 ng/dL Final   Comment: Specimens from patients who are undergoing biotin  therapy and /or ingesting biotin supplements may contain high levels of biotin.  The higher biotin concentration in these specimens interferes with this Free T4 assay.  Specimens that contain high levels  of biotin may cause false high results for this Free T4 assay.  Please interpret results in light of the total clinical presentation of the patient.     TSH normal. PRL normal. Will attempt to decrease the Cabergoline dose to 0.25 mg biw and recheck PRL level in 2 months.  Philemon Kingdom, MD PhD [facility] Endocrinology

## 2016-07-30 NOTE — Patient Instructions (Addendum)
Please stop at the lab.  For now, continue Cabergolin 0.5 mg 2x a week.  Continue Levothyroxine 50 mcg daily.  Take the thyroid hormone every day, with water, at least 30 minutes before breakfast, separated by at least 4 hours from: - acid reflux medications - calcium - iron - multivitamins  Please return in 1 year.

## 2016-07-31 LAB — PROLACTIN: PROLACTIN: 17.6 ng/mL

## 2016-08-03 ENCOUNTER — Other Ambulatory Visit: Payer: Self-pay | Admitting: Internal Medicine

## 2016-08-03 ENCOUNTER — Encounter: Payer: Self-pay | Admitting: Internal Medicine

## 2016-08-03 MED ORDER — CABERGOLINE 0.5 MG PO TABS: ORAL_TABLET | ORAL | 2 refills | Status: DC

## 2016-09-04 ENCOUNTER — Other Ambulatory Visit: Payer: Self-pay | Admitting: Internal Medicine

## 2016-12-17 ENCOUNTER — Other Ambulatory Visit: Payer: Self-pay | Admitting: Internal Medicine

## 2017-05-23 ENCOUNTER — Other Ambulatory Visit: Payer: Self-pay | Admitting: Internal Medicine

## 2017-07-29 ENCOUNTER — Ambulatory Visit: Payer: PRIVATE HEALTH INSURANCE | Admitting: Internal Medicine

## 2017-07-29 DIAGNOSIS — Z0289 Encounter for other administrative examinations: Secondary | ICD-10-CM

## 2017-09-02 ENCOUNTER — Other Ambulatory Visit: Payer: Self-pay | Admitting: Internal Medicine
